# Patient Record
Sex: Female | Born: 1950 | Race: Black or African American | Hispanic: No | Marital: Married | Smoking: Never smoker
Health system: Southern US, Community
[De-identification: ages and names within clinical notes are randomized; demographics above are authoritative.]

## PROBLEM LIST (undated history)

## (undated) DIAGNOSIS — I10 Essential (primary) hypertension: Secondary | ICD-10-CM

---

## 1999-05-20 ENCOUNTER — Other Ambulatory Visit: Admission: RE | Admit: 1999-05-20 | Discharge: 1999-05-20 | Payer: Self-pay | Admitting: Internal Medicine

## 2000-02-25 ENCOUNTER — Encounter: Payer: Self-pay | Admitting: Internal Medicine

## 2000-02-25 ENCOUNTER — Encounter: Admission: RE | Admit: 2000-02-25 | Discharge: 2000-02-25 | Payer: Self-pay | Admitting: Internal Medicine

## 2000-06-17 ENCOUNTER — Other Ambulatory Visit: Admission: RE | Admit: 2000-06-17 | Discharge: 2000-06-17 | Payer: Self-pay | Admitting: Internal Medicine

## 2001-01-12 ENCOUNTER — Ambulatory Visit (HOSPITAL_COMMUNITY): Admission: RE | Admit: 2001-01-12 | Discharge: 2001-01-12 | Payer: Self-pay | Admitting: Gastroenterology

## 2001-01-12 ENCOUNTER — Encounter (INDEPENDENT_AMBULATORY_CARE_PROVIDER_SITE_OTHER): Payer: Self-pay | Admitting: Specialist

## 2001-06-14 ENCOUNTER — Other Ambulatory Visit: Admission: RE | Admit: 2001-06-14 | Discharge: 2001-06-14 | Payer: Self-pay | Admitting: Internal Medicine

## 2002-06-16 ENCOUNTER — Other Ambulatory Visit: Admission: RE | Admit: 2002-06-16 | Discharge: 2002-06-16 | Payer: Self-pay | Admitting: Obstetrics and Gynecology

## 2003-07-03 ENCOUNTER — Other Ambulatory Visit: Admission: RE | Admit: 2003-07-03 | Discharge: 2003-07-03 | Payer: Self-pay | Admitting: Internal Medicine

## 2004-01-29 ENCOUNTER — Encounter: Admission: RE | Admit: 2004-01-29 | Discharge: 2004-01-29 | Payer: Self-pay | Admitting: Internal Medicine

## 2004-07-23 ENCOUNTER — Other Ambulatory Visit: Admission: RE | Admit: 2004-07-23 | Discharge: 2004-07-23 | Payer: Self-pay | Admitting: Internal Medicine

## 2005-04-14 ENCOUNTER — Encounter: Admission: RE | Admit: 2005-04-14 | Discharge: 2005-04-14 | Payer: Self-pay | Admitting: Internal Medicine

## 2005-07-24 ENCOUNTER — Other Ambulatory Visit: Admission: RE | Admit: 2005-07-24 | Discharge: 2005-07-24 | Payer: Self-pay | Admitting: Internal Medicine

## 2005-10-02 ENCOUNTER — Encounter: Admission: RE | Admit: 2005-10-02 | Discharge: 2005-10-02 | Payer: Self-pay | Admitting: Internal Medicine

## 2006-04-28 ENCOUNTER — Encounter: Admission: RE | Admit: 2006-04-28 | Discharge: 2006-04-28 | Payer: Self-pay | Admitting: Internal Medicine

## 2006-05-15 ENCOUNTER — Encounter: Admission: RE | Admit: 2006-05-15 | Discharge: 2006-05-15 | Payer: Self-pay | Admitting: Internal Medicine

## 2006-07-27 ENCOUNTER — Other Ambulatory Visit: Admission: RE | Admit: 2006-07-27 | Discharge: 2006-07-27 | Payer: Self-pay | Admitting: Internal Medicine

## 2006-07-28 ENCOUNTER — Encounter: Admission: RE | Admit: 2006-07-28 | Discharge: 2006-07-28 | Payer: Self-pay | Admitting: Internal Medicine

## 2007-05-05 ENCOUNTER — Encounter: Admission: RE | Admit: 2007-05-05 | Discharge: 2007-05-05 | Payer: Self-pay | Admitting: Internal Medicine

## 2007-08-11 ENCOUNTER — Other Ambulatory Visit: Admission: RE | Admit: 2007-08-11 | Discharge: 2007-08-11 | Payer: Self-pay | Admitting: Internal Medicine

## 2008-05-23 ENCOUNTER — Encounter: Admission: RE | Admit: 2008-05-23 | Discharge: 2008-05-23 | Payer: Self-pay | Admitting: Internal Medicine

## 2008-07-05 ENCOUNTER — Encounter: Admission: RE | Admit: 2008-07-05 | Discharge: 2008-07-05 | Payer: Self-pay | Admitting: Internal Medicine

## 2009-02-19 ENCOUNTER — Other Ambulatory Visit: Admission: RE | Admit: 2009-02-19 | Discharge: 2009-02-19 | Payer: Self-pay | Admitting: Obstetrics and Gynecology

## 2009-05-28 ENCOUNTER — Encounter: Admission: RE | Admit: 2009-05-28 | Discharge: 2009-05-28 | Payer: Self-pay | Admitting: Internal Medicine

## 2010-02-19 ENCOUNTER — Other Ambulatory Visit
Admission: RE | Admit: 2010-02-19 | Discharge: 2010-02-19 | Payer: Self-pay | Source: Home / Self Care | Admitting: Obstetrics and Gynecology

## 2010-03-31 ENCOUNTER — Encounter: Payer: Self-pay | Admitting: Internal Medicine

## 2010-04-25 ENCOUNTER — Other Ambulatory Visit: Payer: Self-pay | Admitting: Gastroenterology

## 2010-06-17 ENCOUNTER — Other Ambulatory Visit: Payer: Self-pay | Admitting: Internal Medicine

## 2010-06-17 DIAGNOSIS — Z1231 Encounter for screening mammogram for malignant neoplasm of breast: Secondary | ICD-10-CM

## 2010-06-26 ENCOUNTER — Ambulatory Visit
Admission: RE | Admit: 2010-06-26 | Discharge: 2010-06-26 | Disposition: A | Discharge status: Home / Self Care | Payer: 59 | Source: Ambulatory Visit | Attending: Internal Medicine | Admitting: Internal Medicine

## 2010-06-26 DIAGNOSIS — Z1231 Encounter for screening mammogram for malignant neoplasm of breast: Secondary | ICD-10-CM

## 2011-03-14 ENCOUNTER — Other Ambulatory Visit: Payer: Self-pay | Admitting: Obstetrics and Gynecology

## 2011-03-14 ENCOUNTER — Other Ambulatory Visit (HOSPITAL_COMMUNITY)
Admission: RE | Admit: 2011-03-14 | Discharge: 2011-03-14 | Disposition: A | Discharge status: Home / Self Care | Payer: 59 | Source: Ambulatory Visit | Attending: Obstetrics and Gynecology | Admitting: Obstetrics and Gynecology

## 2011-03-14 DIAGNOSIS — Z01419 Encounter for gynecological examination (general) (routine) without abnormal findings: Secondary | ICD-10-CM | POA: Insufficient documentation

## 2011-07-18 ENCOUNTER — Other Ambulatory Visit: Payer: Self-pay | Admitting: Internal Medicine

## 2011-07-18 DIAGNOSIS — Z1231 Encounter for screening mammogram for malignant neoplasm of breast: Secondary | ICD-10-CM

## 2011-08-05 ENCOUNTER — Ambulatory Visit
Admission: RE | Admit: 2011-08-05 | Discharge: 2011-08-05 | Disposition: A | Discharge status: Home / Self Care | Payer: 59 | Source: Ambulatory Visit | Attending: Internal Medicine | Admitting: Internal Medicine

## 2011-08-05 DIAGNOSIS — Z1231 Encounter for screening mammogram for malignant neoplasm of breast: Secondary | ICD-10-CM

## 2012-05-07 ENCOUNTER — Other Ambulatory Visit: Payer: Self-pay | Admitting: Obstetrics and Gynecology

## 2012-05-07 ENCOUNTER — Other Ambulatory Visit (HOSPITAL_COMMUNITY)
Admission: RE | Admit: 2012-05-07 | Discharge: 2012-05-07 | Disposition: A | Discharge status: Home / Self Care | Payer: 59 | Source: Ambulatory Visit | Attending: Obstetrics and Gynecology | Admitting: Obstetrics and Gynecology

## 2012-05-07 DIAGNOSIS — Z01419 Encounter for gynecological examination (general) (routine) without abnormal findings: Secondary | ICD-10-CM | POA: Insufficient documentation

## 2012-05-07 DIAGNOSIS — Z1151 Encounter for screening for human papillomavirus (HPV): Secondary | ICD-10-CM | POA: Insufficient documentation

## 2012-08-04 ENCOUNTER — Other Ambulatory Visit: Payer: Self-pay

## 2012-08-24 ENCOUNTER — Other Ambulatory Visit: Payer: Self-pay

## 2012-08-24 ENCOUNTER — Ambulatory Visit
Admission: RE | Admit: 2012-08-24 | Discharge: 2012-08-24 | Disposition: A | Discharge status: Home / Self Care | Payer: 59 | Source: Ambulatory Visit

## 2012-08-24 DIAGNOSIS — Z1231 Encounter for screening mammogram for malignant neoplasm of breast: Secondary | ICD-10-CM

## 2013-05-10 ENCOUNTER — Other Ambulatory Visit (HOSPITAL_COMMUNITY)
Admission: RE | Admit: 2013-05-10 | Discharge: 2013-05-10 | Disposition: A | Discharge status: Home / Self Care | Payer: 59 | Source: Ambulatory Visit | Attending: Obstetrics and Gynecology | Admitting: Obstetrics and Gynecology

## 2013-05-10 ENCOUNTER — Other Ambulatory Visit: Payer: Self-pay | Admitting: Obstetrics and Gynecology

## 2013-05-10 DIAGNOSIS — Z01419 Encounter for gynecological examination (general) (routine) without abnormal findings: Secondary | ICD-10-CM | POA: Insufficient documentation

## 2013-08-05 ENCOUNTER — Other Ambulatory Visit: Payer: Self-pay

## 2013-08-05 DIAGNOSIS — Z1231 Encounter for screening mammogram for malignant neoplasm of breast: Secondary | ICD-10-CM

## 2013-08-26 ENCOUNTER — Ambulatory Visit
Admission: RE | Admit: 2013-08-26 | Discharge: 2013-08-26 | Disposition: A | Discharge status: Home / Self Care | Payer: 59 | Source: Ambulatory Visit

## 2013-08-26 DIAGNOSIS — Z1231 Encounter for screening mammogram for malignant neoplasm of breast: Secondary | ICD-10-CM

## 2014-03-31 ENCOUNTER — Emergency Department (HOSPITAL_COMMUNITY)
Admission: EM | Admit: 2014-03-31 | Discharge: 2014-03-31 | Disposition: A | Discharge status: Home / Self Care | Payer: 59 | Attending: Emergency Medicine | Admitting: Emergency Medicine

## 2014-03-31 ENCOUNTER — Encounter (HOSPITAL_COMMUNITY): Payer: Self-pay | Admitting: Emergency Medicine

## 2014-03-31 ENCOUNTER — Emergency Department (HOSPITAL_COMMUNITY): Payer: 59

## 2014-03-31 DIAGNOSIS — R1030 Lower abdominal pain, unspecified: Secondary | ICD-10-CM | POA: Diagnosis present

## 2014-03-31 DIAGNOSIS — I10 Essential (primary) hypertension: Secondary | ICD-10-CM | POA: Diagnosis not present

## 2014-03-31 DIAGNOSIS — Z9889 Other specified postprocedural states: Secondary | ICD-10-CM | POA: Diagnosis not present

## 2014-03-31 DIAGNOSIS — R109 Unspecified abdominal pain: Secondary | ICD-10-CM

## 2014-03-31 DIAGNOSIS — K5730 Diverticulosis of large intestine without perforation or abscess without bleeding: Secondary | ICD-10-CM | POA: Diagnosis not present

## 2014-03-31 DIAGNOSIS — K5790 Diverticulosis of intestine, part unspecified, without perforation or abscess without bleeding: Secondary | ICD-10-CM

## 2014-03-31 DIAGNOSIS — M1990 Unspecified osteoarthritis, unspecified site: Secondary | ICD-10-CM | POA: Insufficient documentation

## 2014-03-31 DIAGNOSIS — M791 Myalgia: Secondary | ICD-10-CM | POA: Insufficient documentation

## 2014-03-31 HISTORY — DX: Essential (primary) hypertension: I10

## 2014-03-31 LAB — URINE MICROSCOPIC-ADD ON

## 2014-03-31 LAB — URINALYSIS, ROUTINE W REFLEX MICROSCOPIC
Bilirubin Urine: NEGATIVE
GLUCOSE, UA: NEGATIVE mg/dL
KETONES UR: NEGATIVE mg/dL
Leukocytes, UA: NEGATIVE
NITRITE: NEGATIVE
PROTEIN: NEGATIVE mg/dL
SPECIFIC GRAVITY, URINE: 1.012 (ref 1.005–1.030)
UROBILINOGEN UA: 0.2 mg/dL (ref 0.0–1.0)
pH: 6 (ref 5.0–8.0)

## 2014-03-31 LAB — COMPREHENSIVE METABOLIC PANEL
ALT: 21 U/L (ref 0–35)
AST: 28 U/L (ref 0–37)
Albumin: 4.3 g/dL (ref 3.5–5.2)
Alkaline Phosphatase: 77 U/L (ref 39–117)
Anion gap: 6 (ref 5–15)
BUN: 10 mg/dL (ref 6–23)
CALCIUM: 9.4 mg/dL (ref 8.4–10.5)
CO2: 29 mmol/L (ref 19–32)
Chloride: 104 mmol/L (ref 96–112)
Creatinine, Ser: 0.91 mg/dL (ref 0.50–1.10)
GFR calc non Af Amer: 66 mL/min — ABNORMAL LOW (ref >90)
GFR, EST AFRICAN AMERICAN: 77 mL/min — AB (ref >90)
GLUCOSE: 101 mg/dL — AB (ref 70–99)
Potassium: 4.2 mmol/L (ref 3.5–5.1)
SODIUM: 139 mmol/L (ref 135–145)
Total Bilirubin: 0.8 mg/dL (ref 0.3–1.2)
Total Protein: 7 g/dL (ref 6.0–8.3)

## 2014-03-31 LAB — CBC WITH DIFFERENTIAL/PLATELET
BASOS PCT: 0 % (ref 0–1)
Basophils Absolute: 0 10*3/uL (ref 0.0–0.1)
EOS ABS: 0.1 10*3/uL (ref 0.0–0.7)
Eosinophils Relative: 1 % (ref 0–5)
HEMATOCRIT: 38.9 % (ref 36.0–46.0)
Hemoglobin: 13.5 g/dL (ref 12.0–15.0)
LYMPHS ABS: 1.8 10*3/uL (ref 0.7–4.0)
LYMPHS PCT: 35 % (ref 12–46)
MCH: 29.7 pg (ref 26.0–34.0)
MCHC: 34.7 g/dL (ref 30.0–36.0)
MCV: 85.5 fL (ref 78.0–100.0)
MONO ABS: 0.5 10*3/uL (ref 0.1–1.0)
MONOS PCT: 9 % (ref 3–12)
NEUTROS PCT: 55 % (ref 43–77)
Neutro Abs: 2.8 10*3/uL (ref 1.7–7.7)
PLATELETS: 287 10*3/uL (ref 150–400)
RBC: 4.55 MIL/uL (ref 3.87–5.11)
RDW: 12.8 % (ref 11.5–15.5)
WBC: 5.1 10*3/uL (ref 4.0–10.5)

## 2014-03-31 MED ORDER — IOHEXOL 300 MG/ML  SOLN
100.0000 mL | Freq: Once | INTRAMUSCULAR | Status: AC | PRN
  Administered 2014-03-31: 100 mL via INTRAVENOUS

## 2014-03-31 MED ORDER — PHENAZOPYRIDINE HCL 200 MG PO TABS: 200.0000 mg | ORAL_TABLET | Freq: Three times a day (TID) | ORAL | Status: AC

## 2014-03-31 MED ORDER — IOHEXOL 300 MG/ML  SOLN
25.0000 mL | INTRAMUSCULAR | Status: AC
  Administered 2014-03-31: 25 mL via ORAL

## 2014-03-31 NOTE — ED Notes (Signed)
Pt reports lower abdominal pain x 30 mins with nausea and diarrhea and burning with urination. Pt sts this is a reoccurring thing.

## 2014-03-31 NOTE — Discharge Instructions (Signed)
Abdominal Pain °Many things can cause abdominal pain. Usually, abdominal pain is not caused by a disease and will improve without treatment. It can often be observed and treated at home. Your health care provider will do a physical exam and possibly order blood tests and X-rays to help determine the seriousness of your pain. However, in many cases, more time must pass before a clear cause of the pain can be found. Before that point, your health care provider may not know if you need more testing or further treatment. °HOME CARE INSTRUCTIONS  °Monitor your abdominal pain for any changes. The following actions may help to alleviate any discomfort you are experiencing: °· Only take over-the-counter or prescription medicines as directed by your health care provider. °· Do not take laxatives unless directed to do so by your health care provider. °· Try a clear liquid diet (broth, tea, or water) as directed by your health care provider. Slowly move to a bland diet as tolerated. °SEEK MEDICAL CARE IF: °· You have unexplained abdominal pain. °· You have abdominal pain associated with nausea or diarrhea. °· You have pain when you urinate or have a bowel movement. °· You experience abdominal pain that wakes you in the night. °· You have abdominal pain that is worsened or improved by eating food. °· You have abdominal pain that is worsened with eating fatty foods. °· You have a fever. °SEEK IMMEDIATE MEDICAL CARE IF:  °· Your pain does not go away within 2 hours. °· You keep throwing up (vomiting). °· Your pain is felt only in portions of the abdomen, such as the right side or the left lower portion of the abdomen. °· You pass bloody or black tarry stools. °MAKE SURE YOU: °· Understand these instructions.   °· Will watch your condition.   °· Will get help right away if you are not doing well or get worse.   °Document Released: 12/04/2004 Document Revised: 03/01/2013 Document Reviewed: 11/03/2012 °[employer]® Patient Information  ©2015 [employer]. This information is not intended to replace advice given to you by your health care provider. Make sure you discuss any questions you have with your health care provider. ° °Diverticulosis °Diverticulosis is the condition that develops when small pouches (diverticula) form in the wall of your colon. Your colon, or large intestine, is where water is absorbed and stool is formed. The pouches form when the inside layer of your colon pushes through weak spots in the outer layers of your colon. °CAUSES  °No one knows exactly what causes diverticulosis. °RISK FACTORS °· Being older than 50. Your risk for this condition increases with age. Diverticulosis is rare in people younger than 40 years. By age 80, almost everyone has it. °· Eating a low-fiber diet. °· Being frequently constipated. °· Being overweight. °· Not getting enough exercise. °· Smoking. °· Taking over-the-counter pain medicines, like aspirin and ibuprofen. °SYMPTOMS  °Most people with diverticulosis do not have symptoms. °DIAGNOSIS  °Because diverticulosis often has no symptoms, health care providers often discover the condition during an exam for other colon problems. In many cases, a health care provider will diagnose diverticulosis while using a flexible scope to examine the colon (colonoscopy). °TREATMENT  °If you have never developed an infection related to diverticulosis, you may not need treatment. If you have had an infection before, treatment may include: °· Eating more fruits, vegetables, and grains. °· Taking a fiber supplement. °· Taking a live bacteria supplement (probiotic). °· Taking medicine to relax your colon. °HOME CARE INSTRUCTIONS  °·   Drink at least 6-8 glasses of water each day to prevent constipation. °· Try not to strain when you have a bowel movement. °· Keep all follow-up appointments. °If you have had an infection before:  °· Increase the fiber in your diet as directed by your health care provider or  dietitian. °· Take a dietary fiber supplement if your health care provider approves. °· Only take medicines as directed by your health care provider. °SEEK MEDICAL CARE IF:  °· You have abdominal pain. °· You have bloating. °· You have cramps. °· You have not gone to the bathroom in 3 days. °SEEK IMMEDIATE MEDICAL CARE IF:  °· Your pain gets worse. °· Your bloating becomes very bad. °· You have a fever or chills, and your symptoms suddenly get worse. °· You begin vomiting. °· You have bowel movements that are bloody or black. °MAKE SURE YOU: °· Understand these instructions. °· Will watch your condition. °· Will get help right away if you are not doing well or get worse. °Document Released: 11/22/2003 Document Revised: 03/01/2013 Document Reviewed: 01/19/2013 °[employer]® Patient Information ©2015 [employer]. This information is not intended to replace advice given to you by your health care provider. Make sure you discuss any questions you have with your health care provider. ° °

## 2014-03-31 NOTE — ED Provider Notes (Signed)
CSN: [id number]     Arrival date & time 03/31/14  1548 History   First MD Initiated Contact with Patient 03/31/14 1556     Chief Complaint  Patient presents with  . Abdominal Pain     (Consider location/radiation/quality/duration/timing/severity/associated sxs/prior Treatment) HPI 64 year old female past history of hypertension presents to the ED complaining of acute onset of pressure-like severe lower abdominal pain that began today. Additionally, patient states she has been dealing with 6 months of diffuse shooting electric-type pain throughout her body and in her joints. She denies having history of arthritis. Patient also states a few weeks ago she was seen by her PCP who did some screening labs and noted that she had blood in her urine. Patient states she has mild dysuria today. She does report having some mild diarrhea the last 2 days but denies any nausea, vomiting. There is no suspicious food intake or recent travel reported. Pain got to a 10 out of 10 earlier today and has since improved to 1/10 without any treatment. Patient denies having previous similar symptoms. She does state she has occasional night sweats but denies any recent unintentional weight loss. No other complaints at this time.   Past Medical History  Diagnosis Date  . Hypertension    Past Surgical History  Procedure Laterality Date  . Cesarean section     No family history on file. History  Substance Use Topics  . Smoking status: Never Smoker   . Smokeless tobacco: Not on file  . Alcohol Use: Yes   OB History    No data available     Review of Systems  Constitutional: Negative for fever, chills, activity change and appetite change.  HENT: Negative for congestion, rhinorrhea and sore throat.   Eyes: Negative for visual disturbance.  Respiratory: Negative for cough and shortness of breath.   Cardiovascular: Negative for chest pain, palpitations and leg swelling.  Gastrointestinal: Positive for nausea,  abdominal pain and diarrhea. Negative for vomiting and constipation.  Genitourinary: Negative for dysuria, hematuria, vaginal bleeding and vaginal discharge.  Musculoskeletal: Positive for myalgias and arthralgias. Negative for back pain and neck pain.  Skin: Negative for rash.  Neurological: Negative for weakness and headaches.  All other systems reviewed and are negative.     Allergies  Review of patient's allergies indicates no known allergies.  Home Medications   Prior to Admission medications   Not on File   BP 142/88 mmHg  Pulse 79  Temp(Src) 98.1 F (36.7 C) (Oral)  Resp 18  Ht 5\' 6"  (1.676 m)  Wt 191 lb (86.637 kg)  BMI 30.84 kg/m2  SpO2 99% Physical Exam  Constitutional: She is oriented to person, place, and time. She appears well-developed and well-nourished. No distress.  HENT:  Head: Normocephalic and atraumatic.  Eyes: Conjunctivae are normal.  Neck: Normal range of motion.  Cardiovascular: Normal rate, regular rhythm, normal heart sounds and intact distal pulses.   No murmur heard. Pulmonary/Chest: Effort normal and breath sounds normal. No respiratory distress. She has no wheezes. She has no rales. She exhibits no tenderness.  Abdominal: Soft. Bowel sounds are normal. She exhibits no distension and no mass. There is tenderness (mild suprapubic). There is no rebound and no guarding.  Musculoskeletal: Normal range of motion.  Neurological: She is alert and oriented to person, place, and time. No cranial nerve deficit.  Skin: Skin is warm and dry.  Psychiatric: She has a normal mood and affect.  Nursing note and vitals reviewed.  ED Course  Procedures (including critical care time) Labs Review Labs Reviewed  COMPREHENSIVE METABOLIC PANEL - Abnormal; Notable for the following:    Glucose, Bld 101 (*)    GFR calc non Af Amer 66 (*)    GFR calc Af Amer 77 (*)    All other components within normal limits  URINALYSIS, ROUTINE W REFLEX MICROSCOPIC -  Abnormal; Notable for the following:    Hgb urine dipstick SMALL (*)    All other components within normal limits  URINE CULTURE  CBC WITH DIFFERENTIAL/PLATELET  URINE MICROSCOPIC-ADD ON    Imaging Review Ct Abdomen Pelvis W Contrast  03/31/2014   CLINICAL DATA:  Lower abdomen pain starting today with a nausea, diarrhea and burning with urination.  EXAM: CT ABDOMEN AND PELVIS WITH CONTRAST  TECHNIQUE: Multidetector CT imaging of the abdomen and pelvis was performed using the standard protocol following bolus administration of intravenous contrast.  CONTRAST:  OMNIPAQUE IOHEXOL 300 MG/ML  SOLN  COMPARISON:  July 05, 2008  FINDINGS: There is a 3 mm low density in the anterior liver on image 12. This is too small to characterize but statistically most likely is liver cysts. The liver is otherwise normal. The spleen, pancreas, gallbladder, adrenal glands and left kidney are normal. There is an 8 mm cyst in the lateral midpole right kidney. There is no hydronephrosis bilaterally. The aorta is normal without aneurysmal dilatation. There is no abdominal lymphadenopathy. There is diverticulosis of colon without diverticulitis. There is no small bowel obstruction. The appendix is normal.  Fluid-filled bladder is normal. There is mild lobulated contour of the uterus unchanged compared to the prior CT suspect fibroid uterus. There is no pelvic lymphadenopathy. Pelvic phleboliths are noted. The visualized lung bases are clear. No acute abnormalities identified within the visualized bones.  IMPRESSION: No nephrolithiasis or hydroureteronephrosis bilaterally.  Diverticulosis without diverticulitis. There is no evidence of small bowel obstruction or appendicitis.   Electronically Signed   By: Sherian Rein M.D.   On: 03/31/2014 17:55     EKG Interpretation None      MDM   Final diagnoses:  Abdominal pain of unknown etiology  Diverticulosis of intestine without bleeding, unspecified intestinal tract  location    H&P as above. Patient presenting with acute onset abdominal pain and mild dysuria. Patient has a nonacute abdominal exam. She does have some mild tenderness to palpation over her suprapubic region. We will obtain screening labs and CT imaging for further evaluation. Patient climbs pain or nausea medication at this time. Diverticulosis without signs of diverticulitis. Labs neg. Pt given pyridium for dysuria.  Instructions given on increasing water and/or adding stool softener. Had a lengthy discussion with patient and her family regarding findings from today. I have advised him to follow closely with her PCP. She's been advised to try Tylenol and Motrin when necessary in addition to Pyridium. Strict return precautions have been discussed and patient voices understanding. Stable for discharge.  Clinical Impression: 1. Diverticulosis of intestine without bleeding, unspecified intestinal tract location   2. Abdominal pain of unknown etiology     Disposition: Discharge  Condition: Good  I have discussed the results, Dx and Tx plan with the pt(& family if present). He/she/they expressed understanding and agree(s) with the plan. Discharge instructions discussed at great length. Strict return precautions discussed and pt &/or family have verbalized understanding of the instructions. No further questions at time of discharge.    New Prescriptions   PHENAZOPYRIDINE (PYRIDIUM) 200 MG TABLET  Take 1 tablet (200 mg total) by mouth 3 (three) times daily.    Follow Up: [facility] EMERGENCY DEPARTMENT [address] [id number] mc [place] [address] [phone]  If symptoms worsen  [facility]     [address] [place] [address] [phone]  To establish primary care, call above   Pt seen in conjunction with Dr. Purvis Sheffield, MD  Ames Dura, DO [facility] Emergency Medicine  Resident - PGY-2    Ames Dura, MD 03/31/14 1836  Purvis Sheffield, MD 03/31/14 [phone]

## 2014-04-02 LAB — URINE CULTURE: Colony Count: 15000

## 2014-05-18 ENCOUNTER — Other Ambulatory Visit (HOSPITAL_COMMUNITY)
Admission: RE | Admit: 2014-05-18 | Discharge: 2014-05-18 | Disposition: A | Discharge status: Home / Self Care | Payer: 59 | Source: Ambulatory Visit | Attending: Obstetrics and Gynecology | Admitting: Obstetrics and Gynecology

## 2014-05-18 ENCOUNTER — Other Ambulatory Visit: Payer: Self-pay | Admitting: Obstetrics and Gynecology

## 2014-05-18 DIAGNOSIS — Z01419 Encounter for gynecological examination (general) (routine) without abnormal findings: Secondary | ICD-10-CM | POA: Diagnosis present

## 2014-05-30 LAB — CYTOLOGY - PAP

## 2014-11-02 ENCOUNTER — Other Ambulatory Visit: Payer: Self-pay

## 2014-11-02 DIAGNOSIS — Z1231 Encounter for screening mammogram for malignant neoplasm of breast: Secondary | ICD-10-CM

## 2014-11-09 ENCOUNTER — Ambulatory Visit: Payer: 59

## 2014-11-16 ENCOUNTER — Ambulatory Visit: Payer: 59

## 2015-07-04 ENCOUNTER — Other Ambulatory Visit (HOSPITAL_COMMUNITY)
Admission: RE | Admit: 2015-07-04 | Discharge: 2015-07-04 | Disposition: A | Discharge status: Home / Self Care | Payer: 59 | Source: Ambulatory Visit | Attending: Obstetrics and Gynecology | Admitting: Obstetrics and Gynecology

## 2015-07-04 ENCOUNTER — Other Ambulatory Visit: Payer: Self-pay | Admitting: Obstetrics and Gynecology

## 2015-07-04 DIAGNOSIS — Z01419 Encounter for gynecological examination (general) (routine) without abnormal findings: Secondary | ICD-10-CM | POA: Diagnosis present

## 2015-07-04 DIAGNOSIS — Z1151 Encounter for screening for human papillomavirus (HPV): Secondary | ICD-10-CM | POA: Insufficient documentation

## 2015-07-05 LAB — CYTOLOGY - PAP

## 2016-06-09 ENCOUNTER — Other Ambulatory Visit: Payer: Self-pay | Admitting: Internal Medicine

## 2016-06-09 DIAGNOSIS — Z1231 Encounter for screening mammogram for malignant neoplasm of breast: Secondary | ICD-10-CM

## 2016-06-27 ENCOUNTER — Ambulatory Visit
Admission: RE | Admit: 2016-06-27 | Discharge: 2016-06-27 | Disposition: A | Discharge status: Home / Self Care | Payer: 59 | Source: Ambulatory Visit | Attending: Internal Medicine | Admitting: Internal Medicine

## 2016-06-27 DIAGNOSIS — Z1231 Encounter for screening mammogram for malignant neoplasm of breast: Secondary | ICD-10-CM

## 2017-07-31 ENCOUNTER — Other Ambulatory Visit: Payer: Self-pay | Admitting: Internal Medicine

## 2017-07-31 DIAGNOSIS — Z1231 Encounter for screening mammogram for malignant neoplasm of breast: Secondary | ICD-10-CM

## 2017-08-26 ENCOUNTER — Ambulatory Visit
Admission: RE | Admit: 2017-08-26 | Discharge: 2017-08-26 | Disposition: A | Discharge status: Home / Self Care | Payer: Medicare Other | Source: Ambulatory Visit | Attending: Internal Medicine | Admitting: Internal Medicine

## 2017-08-26 DIAGNOSIS — Z1231 Encounter for screening mammogram for malignant neoplasm of breast: Secondary | ICD-10-CM

## 2018-01-20 ENCOUNTER — Other Ambulatory Visit: Payer: Self-pay | Admitting: Internal Medicine

## 2018-01-20 DIAGNOSIS — E2839 Other primary ovarian failure: Secondary | ICD-10-CM

## 2018-03-22 ENCOUNTER — Ambulatory Visit
Admission: RE | Admit: 2018-03-22 | Discharge: 2018-03-22 | Disposition: A | Discharge status: Home / Self Care | Payer: Medicare Other | Source: Ambulatory Visit | Attending: Internal Medicine | Admitting: Internal Medicine

## 2018-03-22 DIAGNOSIS — E2839 Other primary ovarian failure: Secondary | ICD-10-CM

## 2018-10-01 ENCOUNTER — Other Ambulatory Visit: Payer: Self-pay | Admitting: Internal Medicine

## 2018-10-01 DIAGNOSIS — Z1231 Encounter for screening mammogram for malignant neoplasm of breast: Secondary | ICD-10-CM

## 2018-11-17 ENCOUNTER — Other Ambulatory Visit: Payer: Self-pay

## 2018-11-17 ENCOUNTER — Ambulatory Visit
Admission: RE | Admit: 2018-11-17 | Discharge: 2018-11-17 | Disposition: A | Discharge status: Home / Self Care | Payer: Medicare Other | Source: Ambulatory Visit | Attending: Internal Medicine | Admitting: Internal Medicine

## 2018-11-17 DIAGNOSIS — Z1231 Encounter for screening mammogram for malignant neoplasm of breast: Secondary | ICD-10-CM

## 2019-10-17 ENCOUNTER — Other Ambulatory Visit: Payer: Self-pay | Admitting: Internal Medicine

## 2019-10-17 DIAGNOSIS — Z1231 Encounter for screening mammogram for malignant neoplasm of breast: Secondary | ICD-10-CM

## 2019-11-21 ENCOUNTER — Other Ambulatory Visit: Payer: Self-pay

## 2019-11-21 ENCOUNTER — Ambulatory Visit
Admission: RE | Admit: 2019-11-21 | Discharge: 2019-11-21 | Disposition: A | Discharge status: Home / Self Care | Payer: Medicare Other | Source: Ambulatory Visit | Attending: Internal Medicine | Admitting: Internal Medicine

## 2019-11-21 DIAGNOSIS — Z1231 Encounter for screening mammogram for malignant neoplasm of breast: Secondary | ICD-10-CM

## 2020-10-22 ENCOUNTER — Other Ambulatory Visit: Payer: Self-pay | Admitting: Internal Medicine

## 2020-10-22 DIAGNOSIS — Z1231 Encounter for screening mammogram for malignant neoplasm of breast: Secondary | ICD-10-CM

## 2020-11-29 ENCOUNTER — Ambulatory Visit
Admission: RE | Admit: 2020-11-29 | Discharge: 2020-11-29 | Disposition: A | Discharge status: Home / Self Care | Payer: Medicare Other | Source: Ambulatory Visit | Attending: Internal Medicine | Admitting: Internal Medicine

## 2020-11-29 ENCOUNTER — Other Ambulatory Visit: Payer: Self-pay

## 2020-11-29 DIAGNOSIS — Z1231 Encounter for screening mammogram for malignant neoplasm of breast: Secondary | ICD-10-CM

## 2021-11-05 ENCOUNTER — Other Ambulatory Visit: Payer: Self-pay | Admitting: Internal Medicine

## 2021-11-05 DIAGNOSIS — Z1231 Encounter for screening mammogram for malignant neoplasm of breast: Secondary | ICD-10-CM

## 2021-12-02 ENCOUNTER — Ambulatory Visit: Payer: Medicare Other

## 2021-12-27 ENCOUNTER — Ambulatory Visit
Admission: RE | Admit: 2021-12-27 | Discharge: 2021-12-27 | Disposition: A | Discharge status: Home / Self Care | Payer: Medicare Other | Source: Ambulatory Visit | Attending: Internal Medicine | Admitting: Internal Medicine

## 2021-12-27 DIAGNOSIS — Z1231 Encounter for screening mammogram for malignant neoplasm of breast: Secondary | ICD-10-CM

## 2022-07-13 IMAGING — MG MM DIGITAL SCREENING BILAT W/ TOMO AND CAD
8 series · 8 of 24 positions shown · non-contrast
Comparison: Previous exam(s).

CLINICAL DATA: Screening.

EXAM:
DIGITAL SCREENING BILATERAL MAMMOGRAM WITH TOMOSYNTHESIS AND CAD
TECHNIQUE: Bilateral screening digital craniocaudal and mediolateral oblique
mammograms were obtained. Bilateral screening digital breast
tomosynthesis was performed. The images were evaluated with
computer-aided detection.

[L CC synth-2D]
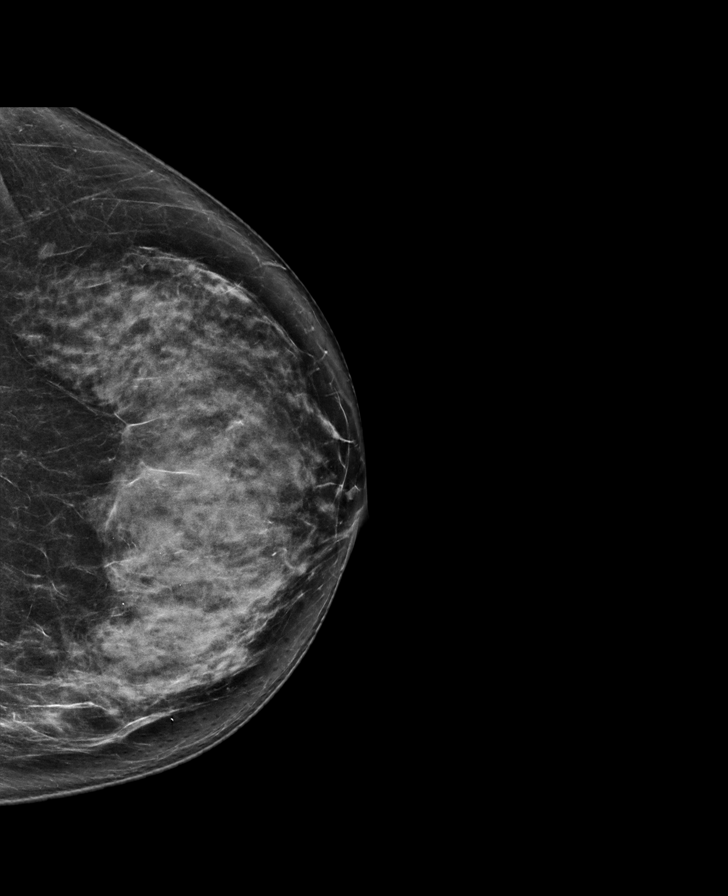

[L MLO synth-2D]
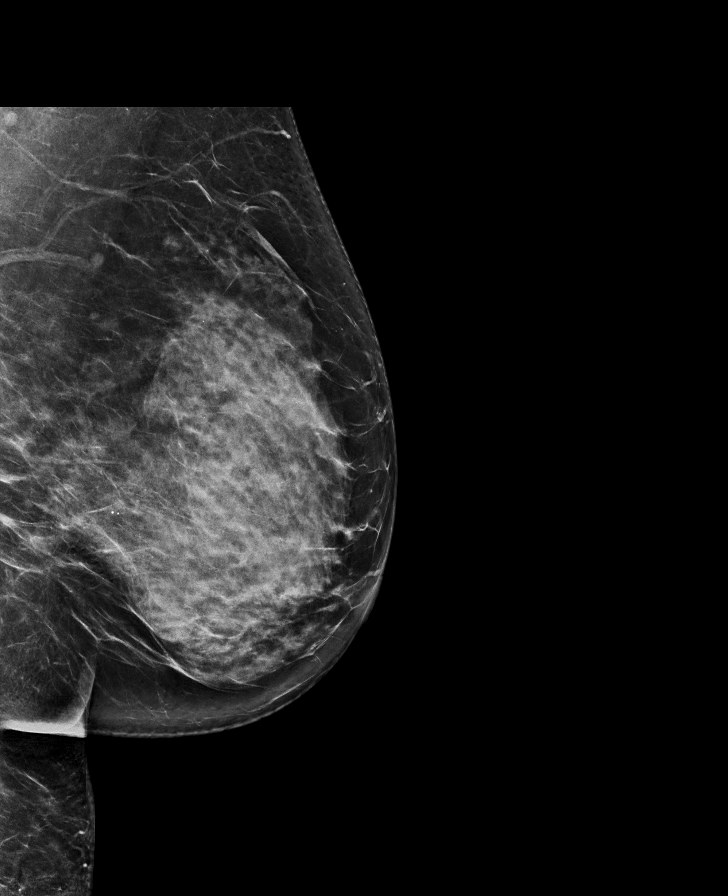

[R CC synth-2D]
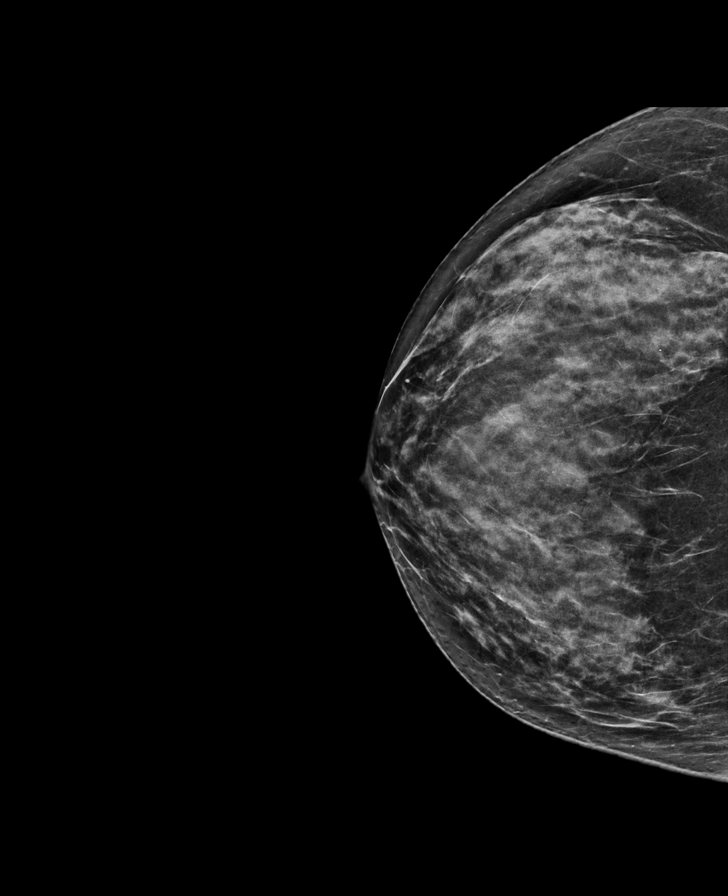

[R MLO synth-2D]
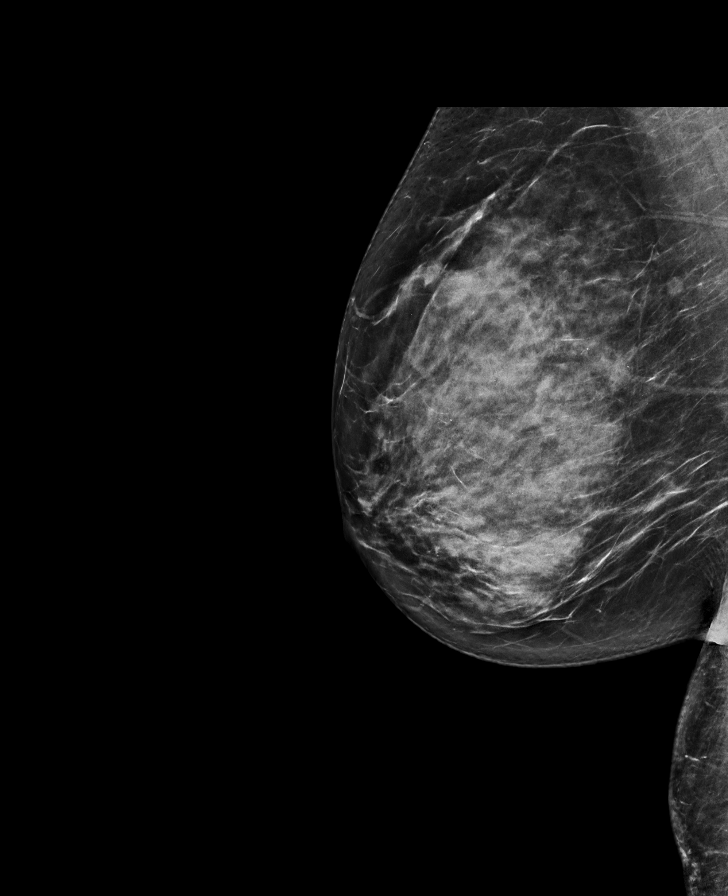

[L CC tomo · tomo slice 39/77.0]
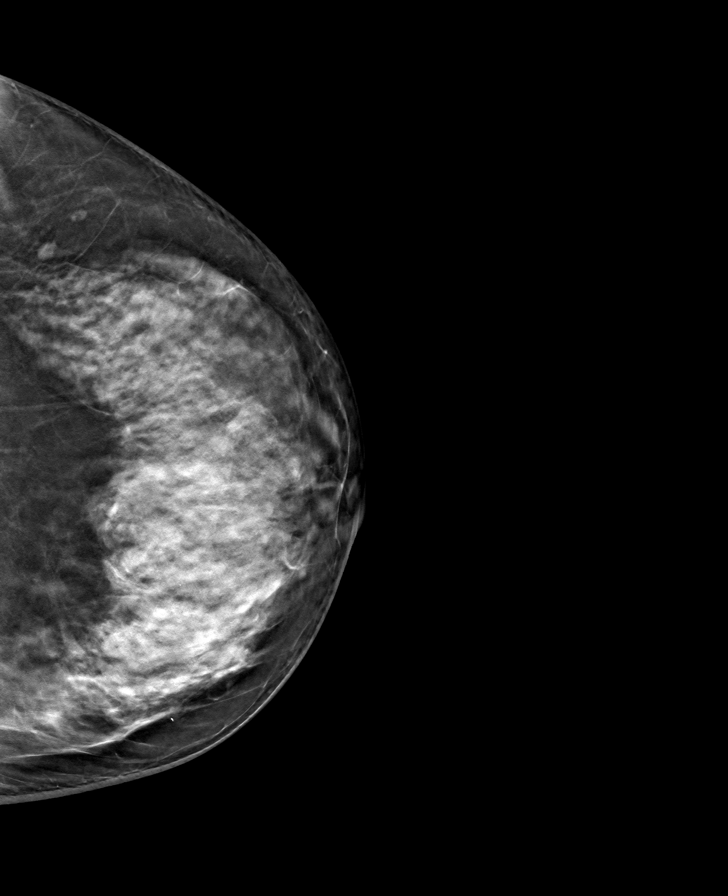

[R CC tomo · tomo slice 35/68.0]
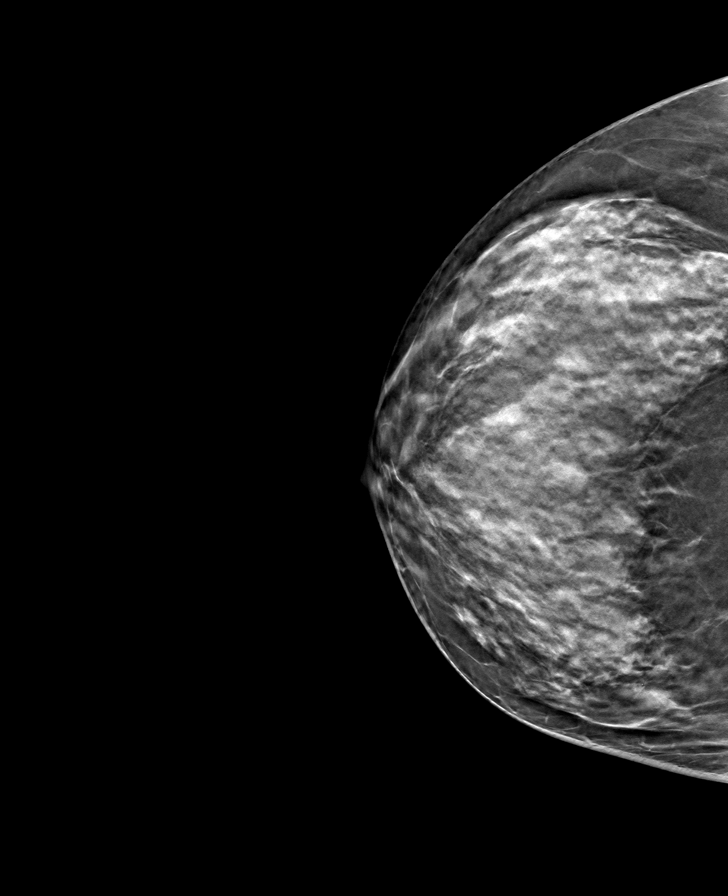

[L MLO tomo · tomo slice 41/81.0]
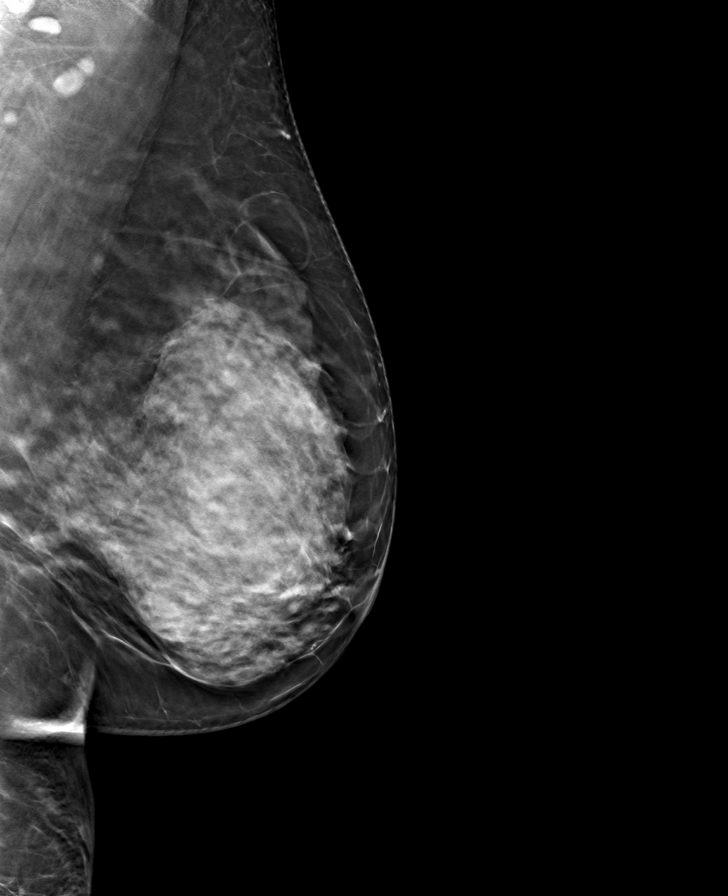

[R MLO tomo · tomo slice 39/77.0]
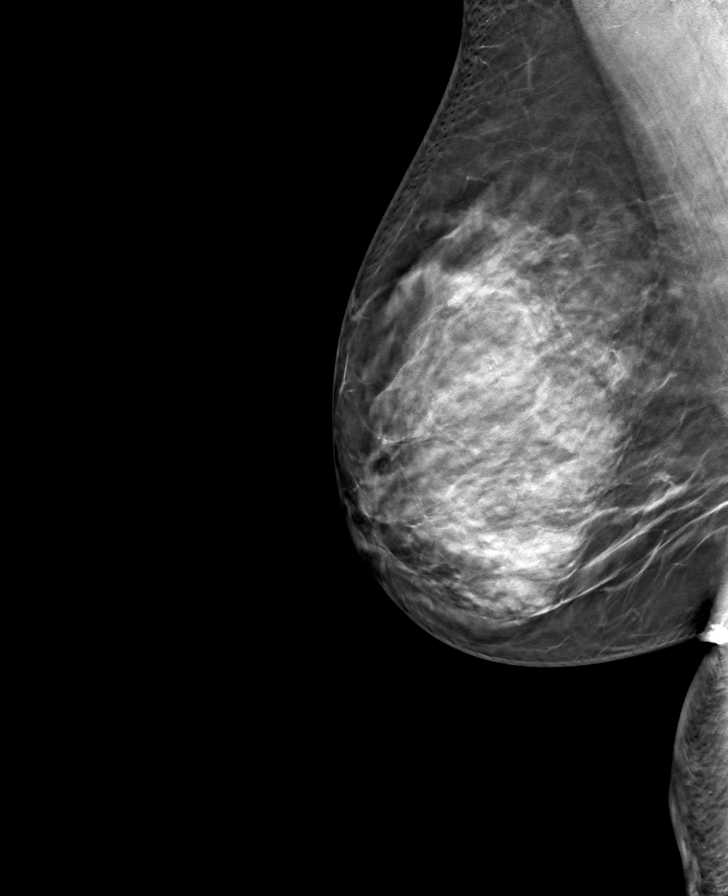

[8 of 24 positions shown; findings below may reference images not displayed]

ACR Breast Density Category d: The breast tissue is extremely dense,
which lowers the sensitivity of mammography
FINDINGS: There are no findings suspicious for malignancy.
IMPRESSION: No mammographic evidence of malignancy. A result letter of this
screening mammogram will be mailed directly to the patient.

RECOMMENDATION:
Screening mammogram in one year. (Code:TA-V-WV9)

BI-RADS CATEGORY  1: Negative.

## 2022-11-26 ENCOUNTER — Other Ambulatory Visit: Payer: Self-pay | Admitting: Internal Medicine

## 2022-11-26 DIAGNOSIS — Z1231 Encounter for screening mammogram for malignant neoplasm of breast: Secondary | ICD-10-CM

## 2022-12-29 ENCOUNTER — Ambulatory Visit
Admission: RE | Admit: 2022-12-29 | Discharge: 2022-12-29 | Disposition: A | Discharge status: Home / Self Care | Payer: Medicare Other | Source: Ambulatory Visit | Attending: Internal Medicine | Admitting: Internal Medicine

## 2022-12-29 DIAGNOSIS — Z1231 Encounter for screening mammogram for malignant neoplasm of breast: Secondary | ICD-10-CM

## 2023-01-01 ENCOUNTER — Other Ambulatory Visit: Payer: Self-pay | Admitting: Internal Medicine

## 2023-01-01 DIAGNOSIS — R928 Other abnormal and inconclusive findings on diagnostic imaging of breast: Secondary | ICD-10-CM

## 2023-01-21 ENCOUNTER — Other Ambulatory Visit: Payer: Self-pay | Admitting: Internal Medicine

## 2023-01-21 ENCOUNTER — Ambulatory Visit
Admission: RE | Admit: 2023-01-21 | Discharge: 2023-01-21 | Disposition: A | Discharge status: Home / Self Care | Payer: Medicare Other | Source: Ambulatory Visit | Attending: Internal Medicine | Admitting: Internal Medicine

## 2023-01-21 DIAGNOSIS — R921 Mammographic calcification found on diagnostic imaging of breast: Secondary | ICD-10-CM

## 2023-01-21 DIAGNOSIS — R928 Other abnormal and inconclusive findings on diagnostic imaging of breast: Secondary | ICD-10-CM

## 2023-06-30 ENCOUNTER — Other Ambulatory Visit: Payer: Self-pay | Admitting: Internal Medicine

## 2023-06-30 ENCOUNTER — Encounter

## 2023-06-30 DIAGNOSIS — E2839 Other primary ovarian failure: Secondary | ICD-10-CM

## 2023-07-02 ENCOUNTER — Encounter

## 2023-07-28 ENCOUNTER — Encounter

## 2023-08-26 ENCOUNTER — Ambulatory Visit
Admission: RE | Admit: 2023-08-26 | Discharge: 2023-08-26 | Disposition: A | Discharge status: Home / Self Care | Source: Ambulatory Visit | Attending: Internal Medicine | Admitting: Internal Medicine

## 2023-08-26 DIAGNOSIS — R921 Mammographic calcification found on diagnostic imaging of breast: Secondary | ICD-10-CM

## 2023-08-27 ENCOUNTER — Other Ambulatory Visit: Payer: Self-pay | Admitting: Internal Medicine

## 2023-08-27 DIAGNOSIS — R921 Mammographic calcification found on diagnostic imaging of breast: Secondary | ICD-10-CM

## 2023-12-31 ENCOUNTER — Encounter

## 2024-01-26 ENCOUNTER — Ambulatory Visit
Admission: RE | Admit: 2024-01-26 | Discharge: 2024-01-26 | Disposition: A | Discharge status: Home / Self Care | Source: Ambulatory Visit | Attending: Internal Medicine | Admitting: Internal Medicine

## 2024-01-26 DIAGNOSIS — R921 Mammographic calcification found on diagnostic imaging of breast: Secondary | ICD-10-CM

## 2024-03-16 ENCOUNTER — Other Ambulatory Visit (HOSPITAL_BASED_OUTPATIENT_CLINIC_OR_DEPARTMENT_OTHER)

## 2024-03-16 ENCOUNTER — Other Ambulatory Visit
